# Patient Record
Sex: Female | Born: 1970 | Race: Black or African American | Hispanic: No | Marital: Single | Smoking: Never smoker
Health system: Southern US, Community
[De-identification: ages and names within clinical notes are randomized; demographics above are authoritative.]

## PROBLEM LIST (undated history)

## (undated) DIAGNOSIS — J45909 Unspecified asthma, uncomplicated: Secondary | ICD-10-CM

## (undated) HISTORY — PX: ABDOMINAL HYSTERECTOMY: SHX81

## (undated) HISTORY — DX: Unspecified asthma, uncomplicated: J45.909

---

## 2014-11-26 ENCOUNTER — Ambulatory Visit (INDEPENDENT_AMBULATORY_CARE_PROVIDER_SITE_OTHER): Payer: BLUE CROSS/BLUE SHIELD | Admitting: Internal Medicine

## 2014-11-26 ENCOUNTER — Ambulatory Visit (INDEPENDENT_AMBULATORY_CARE_PROVIDER_SITE_OTHER): Payer: BLUE CROSS/BLUE SHIELD

## 2014-11-26 VITALS — BP 130/94 | HR 111 | Temp 98.6°F | Resp 20 | Ht 66.0 in | Wt 263.8 lb

## 2014-11-26 DIAGNOSIS — M25552 Pain in left hip: Secondary | ICD-10-CM

## 2014-11-26 DIAGNOSIS — S32592A Other specified fracture of left pubis, initial encounter for closed fracture: Secondary | ICD-10-CM

## 2014-11-26 MED ORDER — TRAMADOL HCL 50 MG PO TABS: 50.0000 mg | ORAL_TABLET | Freq: Four times a day (QID) | ORAL | Status: AC | PRN

## 2014-11-26 MED ORDER — MELOXICAM 15 MG PO TABS: 15.0000 mg | ORAL_TABLET | Freq: Every day | ORAL | Status: AC

## 2014-11-26 NOTE — Progress Notes (Signed)
° °  Subjective:  This chart was scribed for Sydney Siaobert Doolittle, MD by Sydney Randall, Medical scribe. This patient was seen in ROOM 9 and the patient's care was started 4:21 PM.   Patient ID: Sydney Randall, female    DOB: 01/01/1971, 44 y.o.   MRN: [id number]   Chief Complaint  Patient presents with   Leg Injury    fell on ice on Jan 22-nd. Has pain on Lt groin/ pelvic area , and gets worse   HPI  HPI Comments: Sydney GreenspanConsuelo Randall is a 44 y.o. female who presents to [facility] complaining of fall that occurred 10 days ago. Pt states that she fell on ice when her right foot went forward and her left leg went backwards causing her to strike her right side. She says that initially her right side was hurting, but now complains of left groin and pelvic pain. Pt says that if she stands up or sits down for extended periods of time her left leg and hip begin to hurt. She states that she takes xolair shots every other week. Pt reports that she is on no BP or DM medications.   From [place]-here visiting  There are no active problems to display for this patient.  Past Medical History  Diagnosis Date   Asthma    No current outpatient prescriptions on file prior to visit.   No current facility-administered medications on file prior to visit.   No Known Allergies Past Surgical History  Procedure Laterality Date   Abdominal hysterectomy     Cesarean section     Family History  Problem Relation Age of Onset   Cancer Father     Review of Systems  Musculoskeletal: Positive for myalgias and arthralgias.   no GI or GU symptoms    Objective:   Physical Exam  Constitutional: She is oriented to person, place, and time. She appears well-developed and well-nourished. No distress.  Significantly obese  HENT:  Head: Normocephalic and atraumatic.  Eyes: Conjunctivae and EOM are normal. Pupils are equal, round, and reactive to light.  Neck: Neck supple.  Cardiovascular: Normal rate.     Pulmonary/Chest: Effort normal.  Musculoskeletal: She exhibits edema.  Left hip TTP over the greater trochanter extending into the lateral 5. Has good flexion/ extension. Internal and external rotation intact. There is pain in the groin and hip area with full abduction and full external rotation..  2+ pitting edema in left lower extremity. Only 1+ pitting edema in right lower extremity.  Gait is antalgic  Neurological: She is alert and oriented to person, place, and time. No cranial nerve deficit.  Psychiatric: She has a normal mood and affect. Her behavior is normal.  Nursing note and vitals reviewed.  Left hip x-ray has suggestion of fracture, non-displaced at the symphysis pubis.     Assessment & Plan:  Problem #1 fracture pubic mound in good position  Limit weightbearing Follow-up with orthopedics in [place] Meds ordered this encounter  Medications   traMADol (ULTRAM) 50 MG tablet    Sig: Take 1-2 tablets (50-100 mg total) by mouth every 6 (six) hours as needed.    Dispense:  60 tablet    Refill:  0   meloxicam (MOBIC) 15 MG tablet    Sig: Take 1 tablet (15 mg total) by mouth daily.    Dispense:  30 tablet    Refill:  0

## 2014-11-26 NOTE — Patient Instructions (Signed)
We will call re referral

## 2015-09-13 IMAGING — CR DG HIP (WITH OR WITHOUT PELVIS) 2-3V*L*
3 series · 3 of 3 positions shown · non-contrast
Comparison: None

CLINICAL DATA: LEFT hip pain post fall

EXAM:
LEFT HIP (WITH PELVIS) 2-3 VIEWS

[AP (1 of 2)]
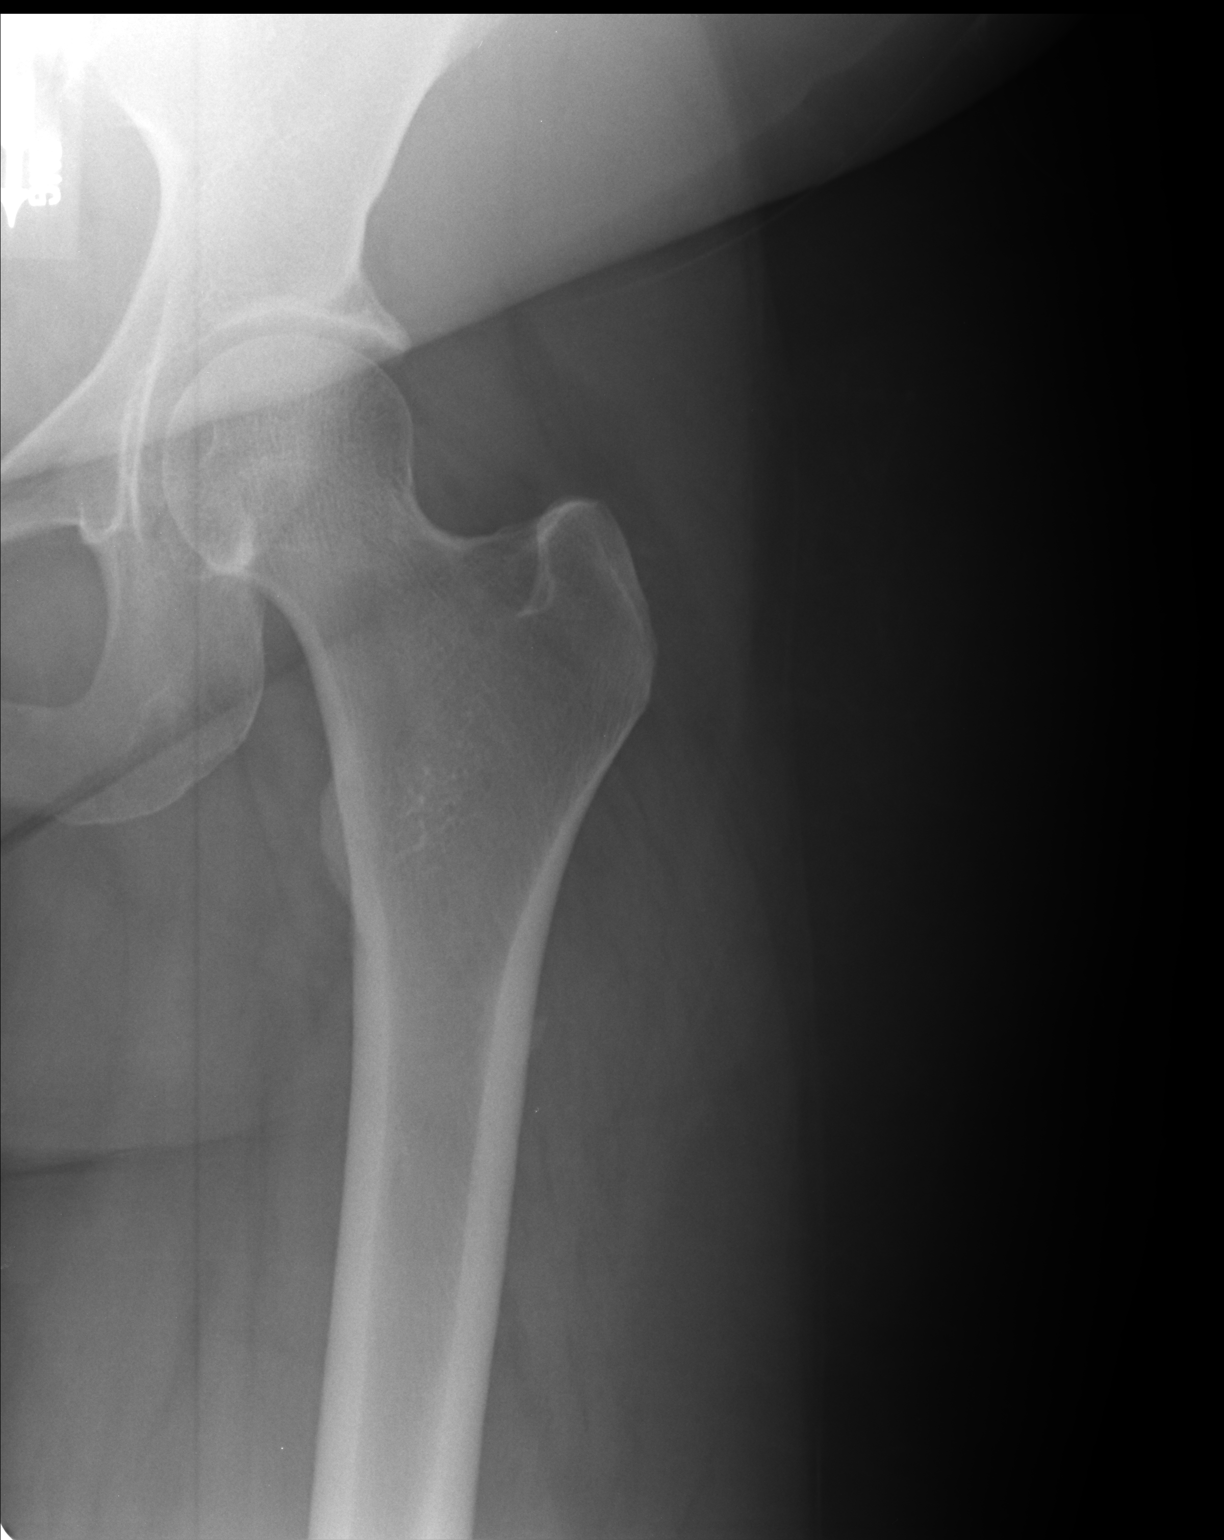

[lateral]
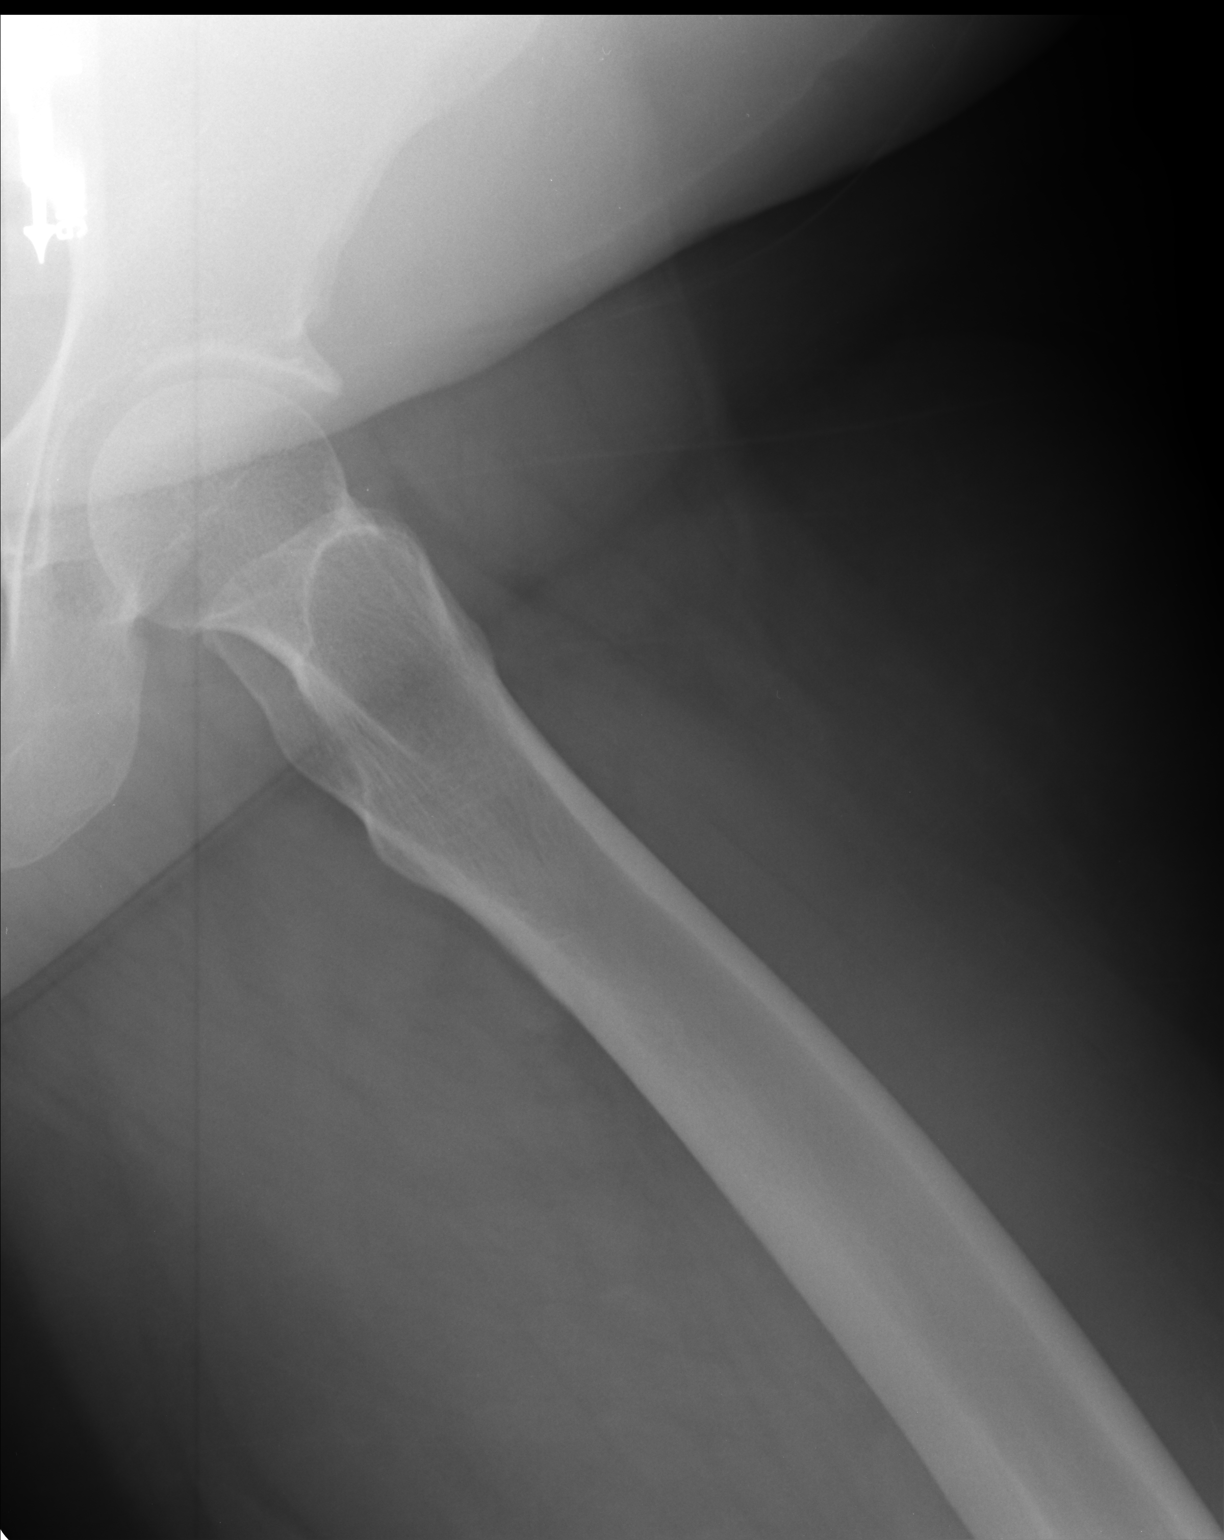

[AP (2 of 2)]
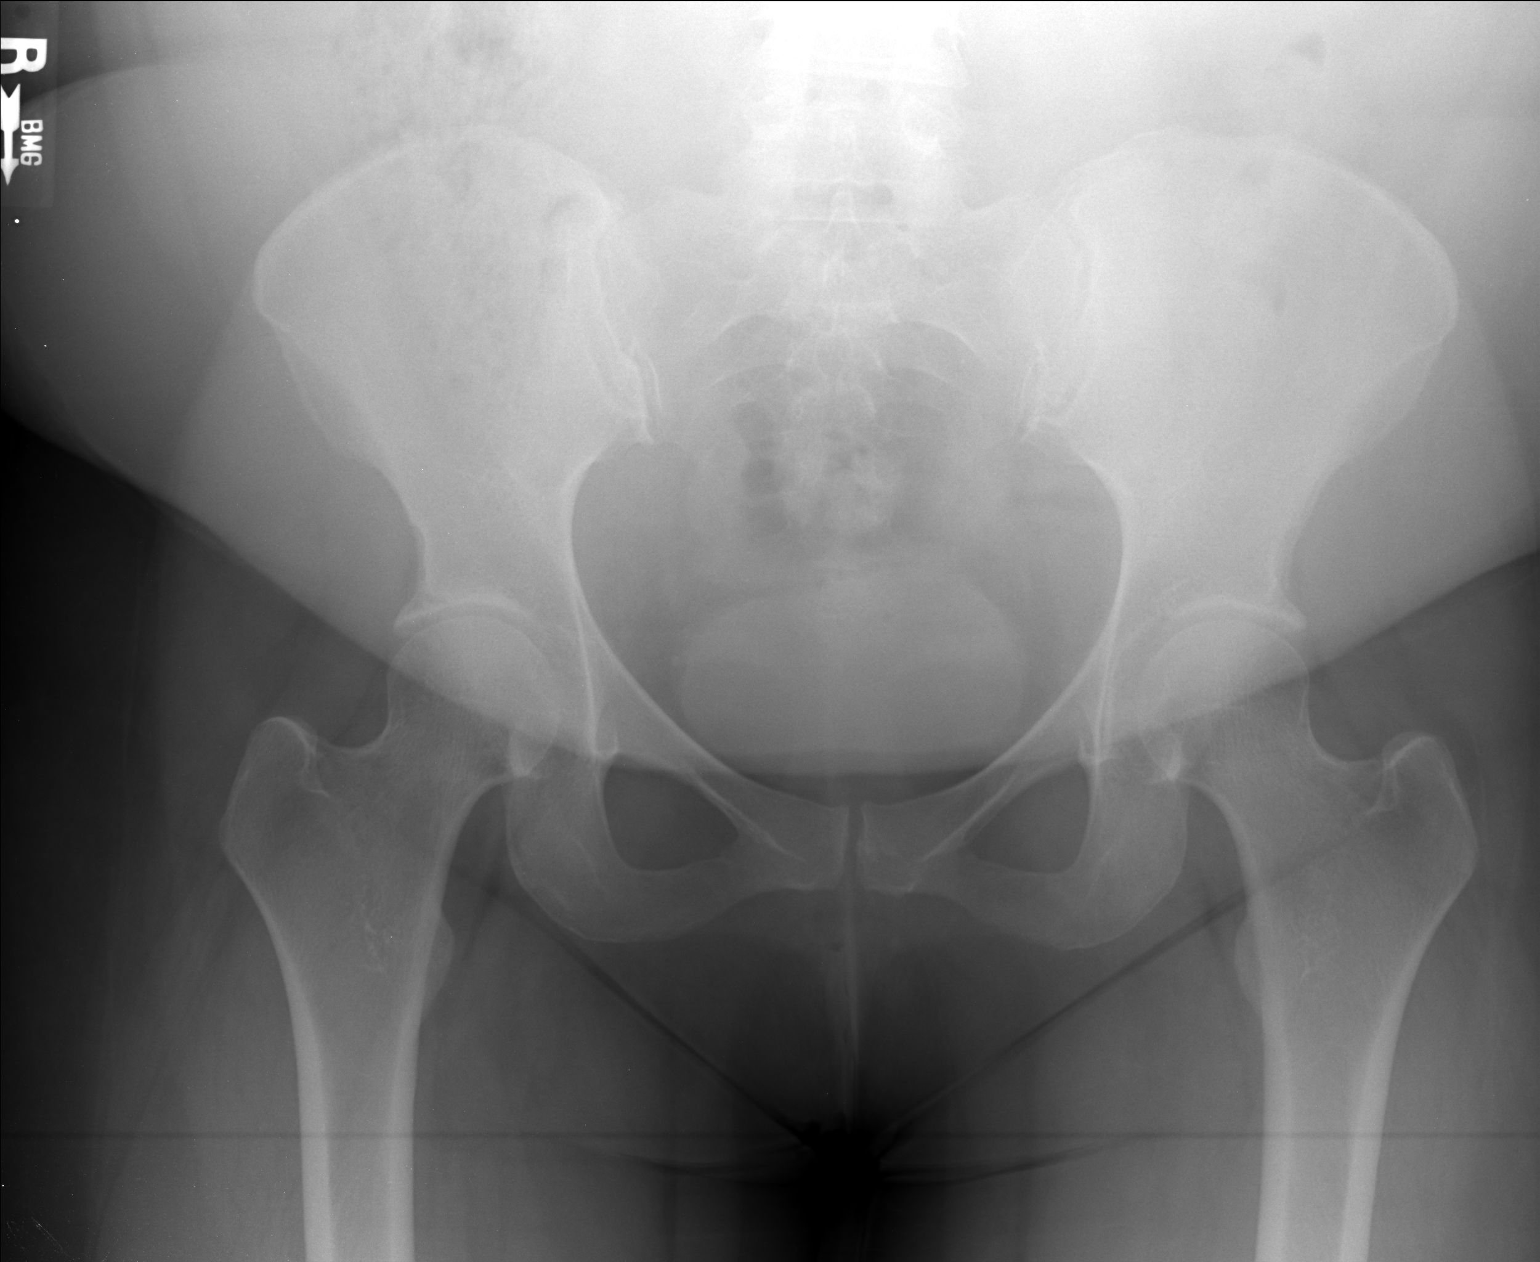

[3 of 3 positions shown; findings below may reference images not displayed]

FINDINGS: Symmetric hip and SI joints.

Osseous mineralization normal.

Questionable nondisplaced fracture at LEFT pubic body.

No additional fracture, dislocation or bone destruction.
IMPRESSION: Questionable nondisplaced fracture at LEFT pubic body.
